# Patient Record
Sex: Female | Born: 1960 | Race: White | Hispanic: No | Marital: Married | Smoking: Never smoker
Health system: Southern US, Community
[De-identification: ages and names within clinical notes are randomized; demographics above are authoritative.]

## PROBLEM LIST (undated history)

## (undated) DIAGNOSIS — F419 Anxiety disorder, unspecified: Secondary | ICD-10-CM

## (undated) DIAGNOSIS — E559 Vitamin D deficiency, unspecified: Secondary | ICD-10-CM

## (undated) DIAGNOSIS — R7303 Prediabetes: Secondary | ICD-10-CM

## (undated) DIAGNOSIS — F32A Depression, unspecified: Secondary | ICD-10-CM

## (undated) DIAGNOSIS — I493 Ventricular premature depolarization: Secondary | ICD-10-CM

## (undated) HISTORY — DX: Ventricular premature depolarization: I49.3

## (undated) HISTORY — DX: Depression, unspecified: F32.A

## (undated) HISTORY — DX: Prediabetes: R73.03

## (undated) HISTORY — DX: Vitamin D deficiency, unspecified: E55.9

## (undated) HISTORY — DX: Anxiety disorder, unspecified: F41.9

## (undated) HISTORY — PX: REFRACTIVE SURGERY: SHX103

---

## 2003-03-22 ENCOUNTER — Ambulatory Visit (HOSPITAL_COMMUNITY): Admission: RE | Admit: 2003-03-22 | Discharge: 2003-03-22 | Payer: Self-pay | Admitting: Gastroenterology

## 2004-01-16 ENCOUNTER — Other Ambulatory Visit: Admission: RE | Admit: 2004-01-16 | Discharge: 2004-01-16 | Payer: Self-pay | Admitting: Family Medicine

## 2004-01-27 ENCOUNTER — Encounter: Admission: RE | Admit: 2004-01-27 | Discharge: 2004-01-27 | Payer: Self-pay | Admitting: Family Medicine

## 2004-02-17 ENCOUNTER — Ambulatory Visit (HOSPITAL_COMMUNITY): Admission: RE | Admit: 2004-02-17 | Discharge: 2004-02-17 | Payer: Self-pay | Admitting: Family Medicine

## 2005-02-04 IMAGING — US US PELVIS COMPLETE MODIFY
1 series · 14 of 25 positions shown · non-contrast
Comparison: none

CLINICAL DATA: Abnormal uterine bleeding.
 PELVIC SONOGRAM WITH TRANSVAGINAL NON-OB
 Overall uterine size and contour within normal limits.  There appear to be no lesions of the myometrium.  
 The endometrium is markedly thickened and inhomogeneous.  It measures as much as 2.2 cm in diameter.  The appearance is worrisome for endometrial carcinoma.  We could be imaging a large blood clot, however.
 The right ovary measures 3.5 x 1.3 x 2.3 cm.  Within it is a 1.6 cm simple cyst.  The left ovary is 2.0 x 1.2 x 1.2 cm .  Within it is a 1.3 cm simple cyst. 
 There is no paraovarian fluid.  A nabothian cyst is noted.  
 IMPRESSION
 1.  Marked thickening and irregularity of the endometrium.  Although this may be a large clot we are imaging, a neoplasm cannot be excluded. 
 2.  No other findings of significance ? there are small bilateral ovarian cysts.

[Series 1: unknown · 0.32mm/px · 14 of 59 slices shown]
[im 1/59]
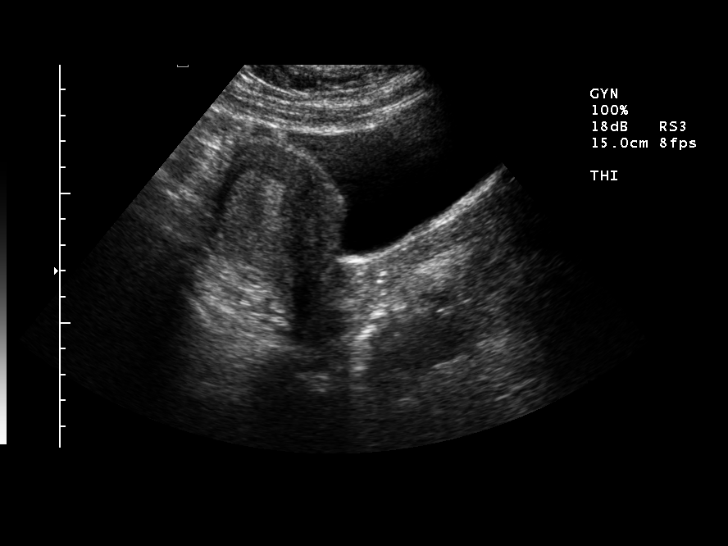
[im 5/59]
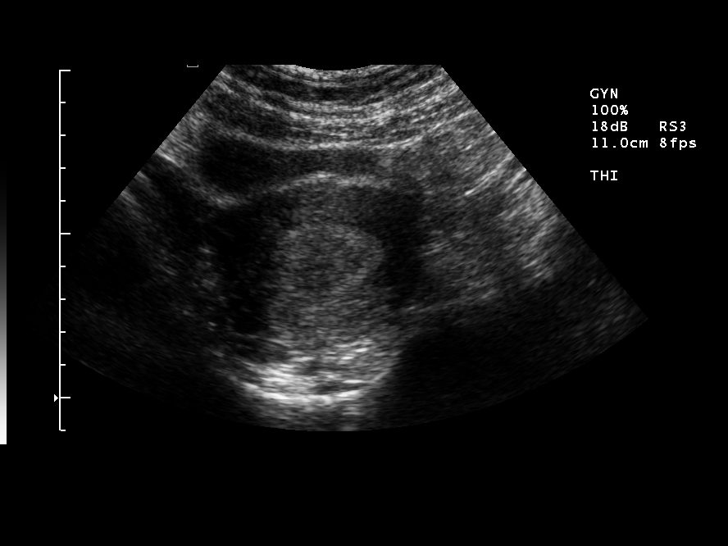
[im 10/59]
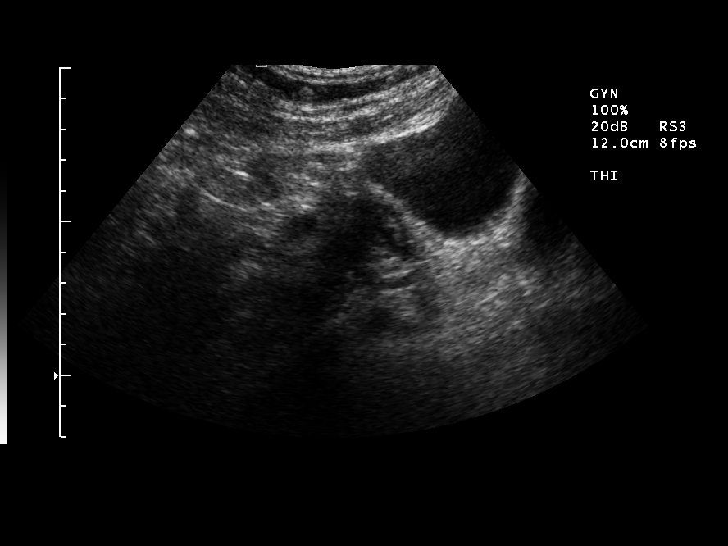
[im 15/59]
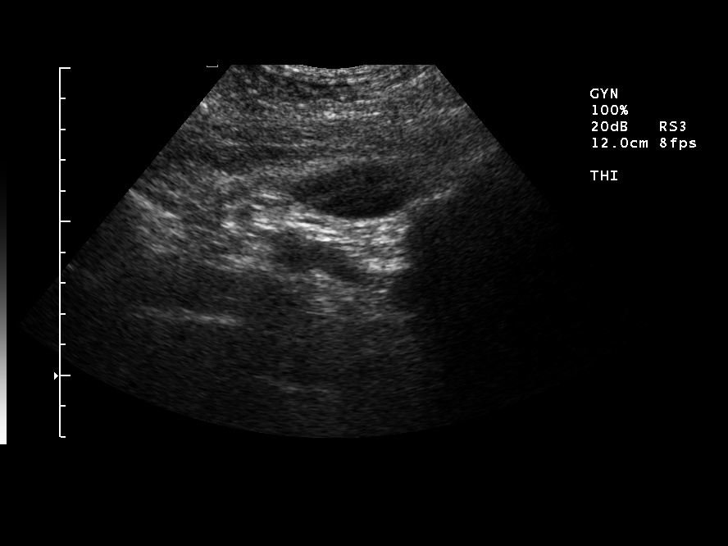
[im 20/59]
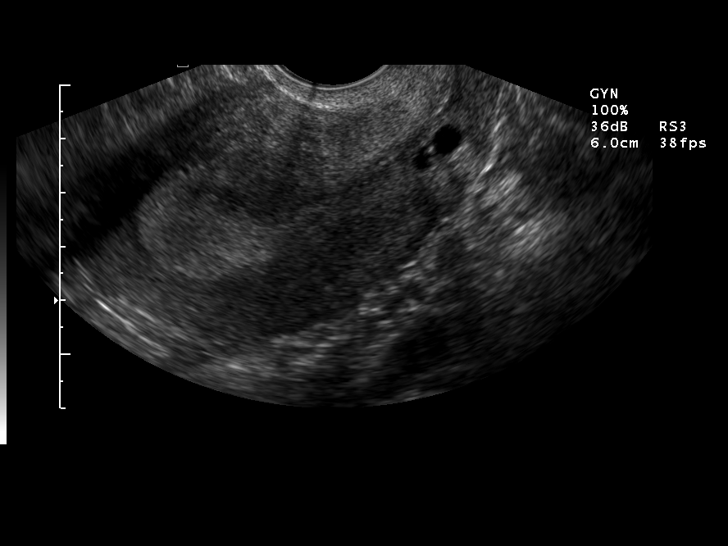
[im 22/59]
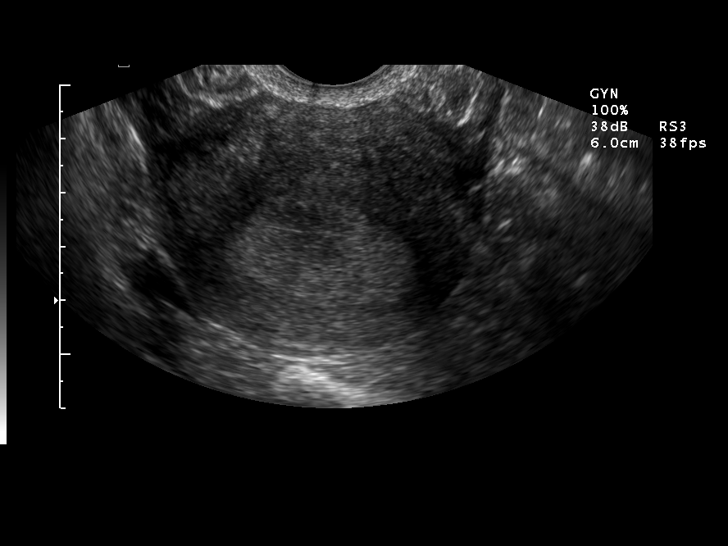
[im 27/59]
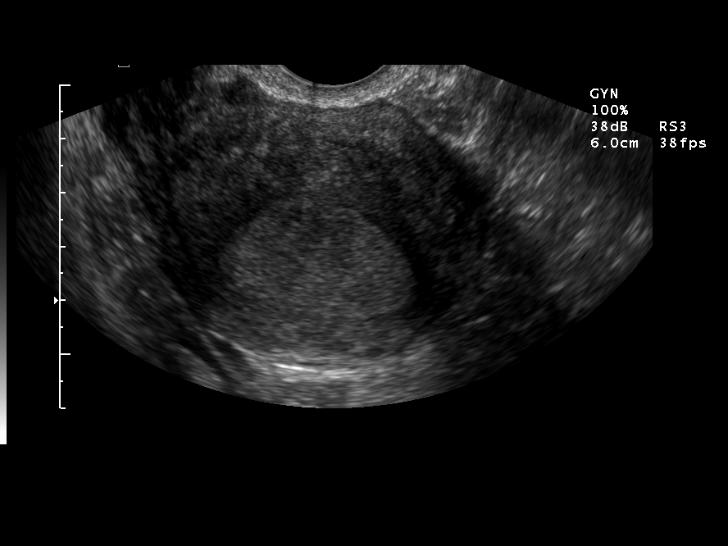
[im 32/59]
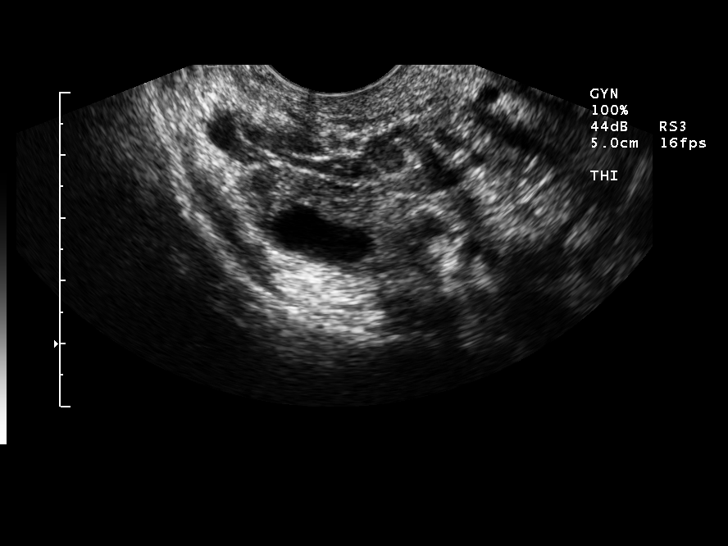
[im 37/59]
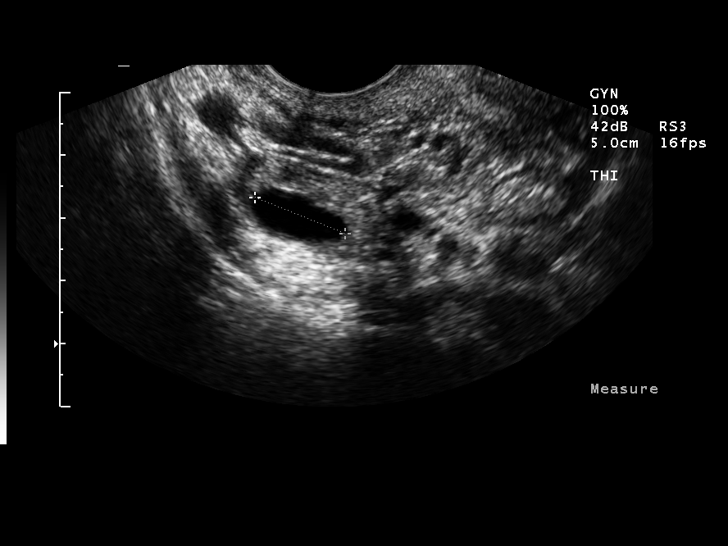
[im 39/59]
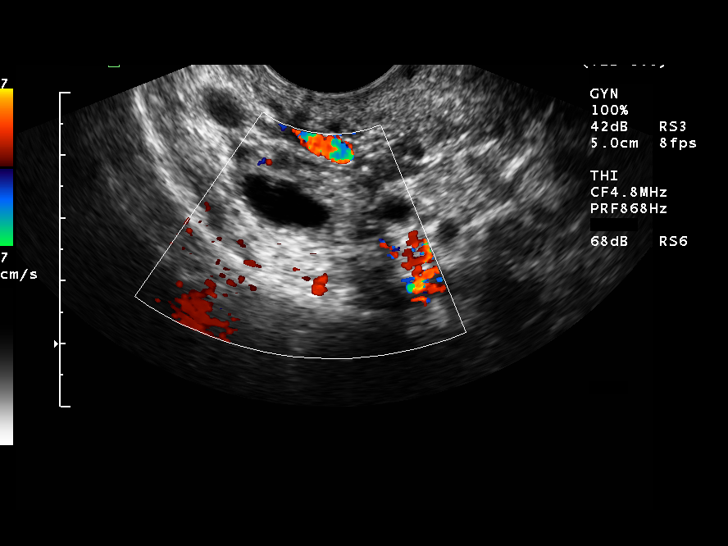
[im 44/59]
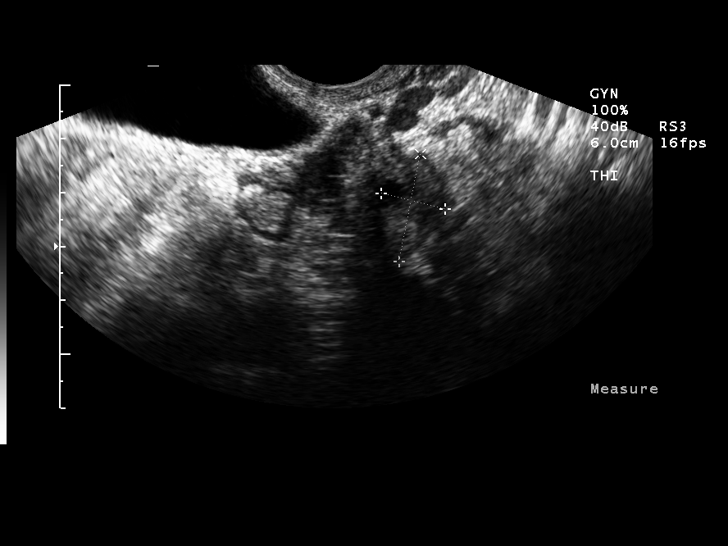
[im 49/59]
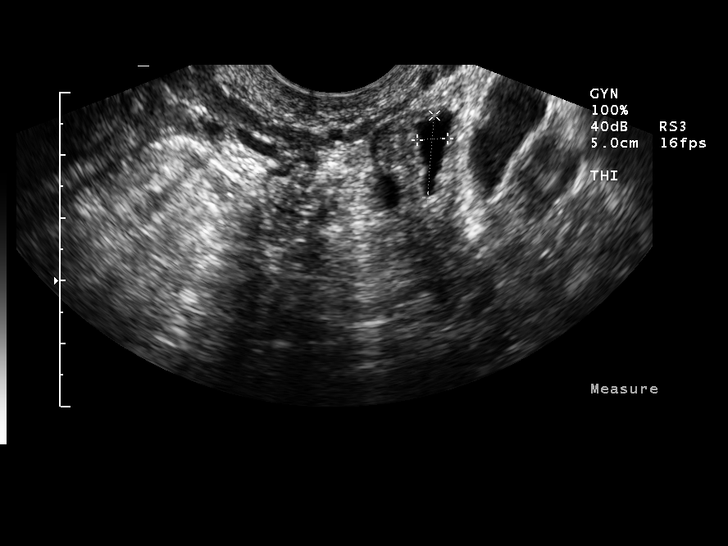
[im 54/59]
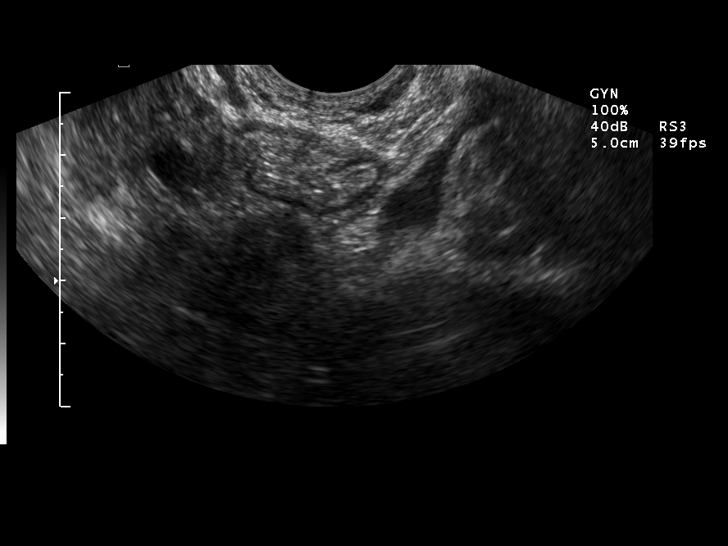
[im 59/59]
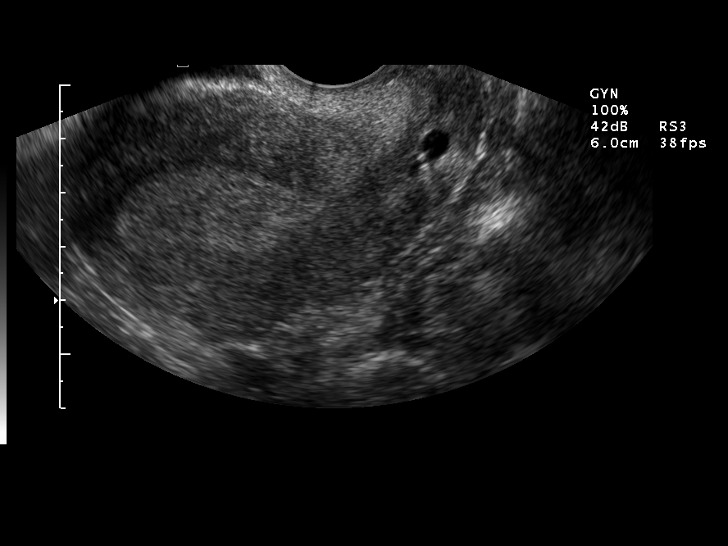

[14 of 25 positions shown; findings below may reference images not displayed]

## 2005-03-16 ENCOUNTER — Other Ambulatory Visit: Admission: RE | Admit: 2005-03-16 | Discharge: 2005-03-16 | Payer: Self-pay | Admitting: Family Medicine

## 2007-04-28 ENCOUNTER — Other Ambulatory Visit: Admission: RE | Admit: 2007-04-28 | Discharge: 2007-04-28 | Payer: Self-pay | Admitting: Family Medicine

## 2008-11-06 LAB — HM COLONOSCOPY

## 2009-09-17 ENCOUNTER — Other Ambulatory Visit: Admission: RE | Admit: 2009-09-17 | Discharge: 2009-09-17 | Payer: Self-pay | Admitting: Family Medicine

## 2010-11-06 NOTE — Op Note (Signed)
   [name], [name]                              ACCOUNT NO.:  [id number]   MEDICAL RECORD NO.:  [id number]                   PATIENT TYPE:  AMB   LOCATION:  ENDO                                 FACILITY:  [facility]   PHYSICIAN:  John C. Madilyn Fireman, M.D.                 DATE OF BIRTH:  19-Sep-1960   DATE OF PROCEDURE:  03/22/2003  DATE OF DISCHARGE:                                 OPERATIVE REPORT   PROCEDURE:  Colonoscopy.   INDICATIONS FOR PROCEDURE:  Family history of colon cancer in a first degree  relative.   DESCRIPTION OF PROCEDURE:  The patient was placed in the left lateral  decubitus position then placed on the pulse monitor with continuous low flow  oxygen delivered by nasal cannula. She was sedated with 87.5 mcg IV fentanyl  and 8 mg IV Versed. The Olympus video colonoscope was inserted into the  rectum and advanced to the cecum, confirmed by transillumination at  McBurney's point and visualization of the ileocecal valve and appendiceal  orifice. The prep was excellent. The cecum, ascending, transverse,  descending and sigmoid colon all appeared normal with no masses, polyps,  diverticula or other mucosal abnormalities. The rectum likewise appeared  normal and retroflexed view of the anus revealed no obvious internal  hemorrhoids. The scope was then withdrawn and the patient returned to the  recovery room in stable condition. She tolerated the procedure well and  there were no immediate complications.   IMPRESSION:  Normal colonoscopy done for indication of family history of  colon cancer.   PLAN:  Repeat study in five years.                                               John C. Madilyn Fireman, M.D.    [facility]MEDQ  D:  03/22/2003  T:  03/23/2003  Job:  [id number]   cc:   Stacie Acres. White, M.D.  [address]  [place]  [place] [address]  Fax: [phone]

## 2011-10-21 ENCOUNTER — Other Ambulatory Visit (HOSPITAL_COMMUNITY)
Admission: RE | Admit: 2011-10-21 | Discharge: 2011-10-21 | Disposition: A | Discharge status: Home / Self Care | Payer: BC Managed Care – PPO | Source: Ambulatory Visit | Attending: Family Medicine | Admitting: Family Medicine

## 2011-10-21 ENCOUNTER — Other Ambulatory Visit: Payer: Self-pay | Admitting: Family Medicine

## 2011-10-21 DIAGNOSIS — Z0000 Encounter for general adult medical examination without abnormal findings: Secondary | ICD-10-CM | POA: Insufficient documentation

## 2014-11-15 ENCOUNTER — Other Ambulatory Visit: Payer: Self-pay | Admitting: Family Medicine

## 2014-11-19 LAB — CYTOLOGY - PAP

## 2015-11-20 DIAGNOSIS — F411 Generalized anxiety disorder: Secondary | ICD-10-CM | POA: Diagnosis not present

## 2015-11-20 DIAGNOSIS — E785 Hyperlipidemia, unspecified: Secondary | ICD-10-CM | POA: Diagnosis not present

## 2015-11-20 DIAGNOSIS — E559 Vitamin D deficiency, unspecified: Secondary | ICD-10-CM | POA: Diagnosis not present

## 2015-11-20 DIAGNOSIS — Z0000 Encounter for general adult medical examination without abnormal findings: Secondary | ICD-10-CM | POA: Diagnosis not present

## 2015-11-20 DIAGNOSIS — M21619 Bunion of unspecified foot: Secondary | ICD-10-CM | POA: Diagnosis not present

## 2016-05-07 DIAGNOSIS — L0100 Impetigo, unspecified: Secondary | ICD-10-CM | POA: Diagnosis not present

## 2016-05-25 DIAGNOSIS — E785 Hyperlipidemia, unspecified: Secondary | ICD-10-CM | POA: Diagnosis not present

## 2016-05-25 DIAGNOSIS — R7301 Impaired fasting glucose: Secondary | ICD-10-CM | POA: Diagnosis not present

## 2016-05-25 DIAGNOSIS — F411 Generalized anxiety disorder: Secondary | ICD-10-CM | POA: Diagnosis not present

## 2016-05-25 DIAGNOSIS — Z23 Encounter for immunization: Secondary | ICD-10-CM | POA: Diagnosis not present

## 2016-05-31 DIAGNOSIS — L309 Dermatitis, unspecified: Secondary | ICD-10-CM | POA: Diagnosis not present

## 2016-08-14 DIAGNOSIS — T783XXA Angioneurotic edema, initial encounter: Secondary | ICD-10-CM | POA: Diagnosis not present

## 2016-08-31 DIAGNOSIS — J305 Allergic rhinitis due to food: Secondary | ICD-10-CM | POA: Diagnosis not present

## 2016-08-31 DIAGNOSIS — J301 Allergic rhinitis due to pollen: Secondary | ICD-10-CM | POA: Diagnosis not present

## 2016-10-22 DIAGNOSIS — Z1231 Encounter for screening mammogram for malignant neoplasm of breast: Secondary | ICD-10-CM | POA: Diagnosis not present

## 2016-11-19 DIAGNOSIS — E559 Vitamin D deficiency, unspecified: Secondary | ICD-10-CM | POA: Diagnosis not present

## 2016-11-19 DIAGNOSIS — R7303 Prediabetes: Secondary | ICD-10-CM | POA: Diagnosis not present

## 2016-11-19 DIAGNOSIS — Z9109 Other allergy status, other than to drugs and biological substances: Secondary | ICD-10-CM | POA: Diagnosis not present

## 2016-11-19 DIAGNOSIS — F419 Anxiety disorder, unspecified: Secondary | ICD-10-CM | POA: Diagnosis not present

## 2016-11-19 DIAGNOSIS — E785 Hyperlipidemia, unspecified: Secondary | ICD-10-CM | POA: Diagnosis not present

## 2016-11-19 DIAGNOSIS — L309 Dermatitis, unspecified: Secondary | ICD-10-CM | POA: Diagnosis not present

## 2017-04-25 DIAGNOSIS — Z23 Encounter for immunization: Secondary | ICD-10-CM | POA: Diagnosis not present

## 2017-06-06 ENCOUNTER — Other Ambulatory Visit: Payer: Self-pay | Admitting: Family Medicine

## 2017-06-06 ENCOUNTER — Other Ambulatory Visit (HOSPITAL_COMMUNITY)
Admission: RE | Admit: 2017-06-06 | Discharge: 2017-06-06 | Disposition: A | Discharge status: Home / Self Care | Payer: BLUE CROSS/BLUE SHIELD | Source: Ambulatory Visit | Attending: Family Medicine | Admitting: Family Medicine

## 2017-06-06 DIAGNOSIS — Z0000 Encounter for general adult medical examination without abnormal findings: Secondary | ICD-10-CM | POA: Diagnosis not present

## 2017-06-06 DIAGNOSIS — F411 Generalized anxiety disorder: Secondary | ICD-10-CM | POA: Diagnosis not present

## 2017-06-06 DIAGNOSIS — Z124 Encounter for screening for malignant neoplasm of cervix: Secondary | ICD-10-CM | POA: Diagnosis not present

## 2017-06-06 DIAGNOSIS — E785 Hyperlipidemia, unspecified: Secondary | ICD-10-CM | POA: Diagnosis not present

## 2017-06-06 DIAGNOSIS — E559 Vitamin D deficiency, unspecified: Secondary | ICD-10-CM | POA: Diagnosis not present

## 2017-06-06 DIAGNOSIS — Z6833 Body mass index (BMI) 33.0-33.9, adult: Secondary | ICD-10-CM | POA: Diagnosis not present

## 2017-06-08 LAB — CYTOLOGY - PAP
DIAGNOSIS: NEGATIVE
HPV (WINDOPATH): NOT DETECTED

## 2017-12-13 DIAGNOSIS — F419 Anxiety disorder, unspecified: Secondary | ICD-10-CM | POA: Diagnosis not present

## 2017-12-13 DIAGNOSIS — Z23 Encounter for immunization: Secondary | ICD-10-CM | POA: Diagnosis not present

## 2018-02-17 DIAGNOSIS — R300 Dysuria: Secondary | ICD-10-CM | POA: Diagnosis not present

## 2018-03-17 DIAGNOSIS — Z23 Encounter for immunization: Secondary | ICD-10-CM | POA: Diagnosis not present

## 2018-11-20 DIAGNOSIS — R300 Dysuria: Secondary | ICD-10-CM | POA: Diagnosis not present

## 2018-11-20 DIAGNOSIS — N3000 Acute cystitis without hematuria: Secondary | ICD-10-CM | POA: Diagnosis not present

## 2018-12-04 DIAGNOSIS — E559 Vitamin D deficiency, unspecified: Secondary | ICD-10-CM | POA: Diagnosis not present

## 2018-12-04 DIAGNOSIS — R7303 Prediabetes: Secondary | ICD-10-CM | POA: Diagnosis not present

## 2018-12-04 DIAGNOSIS — E785 Hyperlipidemia, unspecified: Secondary | ICD-10-CM | POA: Diagnosis not present

## 2018-12-04 DIAGNOSIS — F411 Generalized anxiety disorder: Secondary | ICD-10-CM | POA: Diagnosis not present

## 2019-04-04 DIAGNOSIS — Z1231 Encounter for screening mammogram for malignant neoplasm of breast: Secondary | ICD-10-CM | POA: Diagnosis not present

## 2019-04-24 DIAGNOSIS — E559 Vitamin D deficiency, unspecified: Secondary | ICD-10-CM | POA: Diagnosis not present

## 2019-04-24 DIAGNOSIS — E785 Hyperlipidemia, unspecified: Secondary | ICD-10-CM | POA: Diagnosis not present

## 2019-04-24 DIAGNOSIS — R7303 Prediabetes: Secondary | ICD-10-CM | POA: Diagnosis not present

## 2019-06-04 ENCOUNTER — Other Ambulatory Visit: Payer: Self-pay | Admitting: Family Medicine

## 2019-06-04 DIAGNOSIS — Z1231 Encounter for screening mammogram for malignant neoplasm of breast: Secondary | ICD-10-CM

## 2019-06-12 DIAGNOSIS — Z20828 Contact with and (suspected) exposure to other viral communicable diseases: Secondary | ICD-10-CM | POA: Diagnosis not present

## 2019-12-11 ENCOUNTER — Ambulatory Visit (INDEPENDENT_AMBULATORY_CARE_PROVIDER_SITE_OTHER): Payer: 59 | Admitting: Family Medicine

## 2019-12-11 ENCOUNTER — Encounter (INDEPENDENT_AMBULATORY_CARE_PROVIDER_SITE_OTHER): Payer: Self-pay | Admitting: Family Medicine

## 2019-12-11 ENCOUNTER — Other Ambulatory Visit: Payer: Self-pay

## 2019-12-11 VITALS — BP 122/76 | HR 60 | Temp 98.0°F | Ht 64.0 in | Wt 185.0 lb

## 2019-12-11 DIAGNOSIS — F3289 Other specified depressive episodes: Secondary | ICD-10-CM | POA: Diagnosis not present

## 2019-12-11 DIAGNOSIS — E669 Obesity, unspecified: Secondary | ICD-10-CM

## 2019-12-11 DIAGNOSIS — R7303 Prediabetes: Secondary | ICD-10-CM

## 2019-12-11 DIAGNOSIS — Z0289 Encounter for other administrative examinations: Secondary | ICD-10-CM

## 2019-12-11 DIAGNOSIS — R5383 Other fatigue: Secondary | ICD-10-CM

## 2019-12-11 DIAGNOSIS — Z9189 Other specified personal risk factors, not elsewhere classified: Secondary | ICD-10-CM

## 2019-12-11 DIAGNOSIS — E7849 Other hyperlipidemia: Secondary | ICD-10-CM

## 2019-12-11 DIAGNOSIS — R0602 Shortness of breath: Secondary | ICD-10-CM

## 2019-12-11 DIAGNOSIS — Z6831 Body mass index (BMI) 31.0-31.9, adult: Secondary | ICD-10-CM

## 2019-12-11 DIAGNOSIS — E559 Vitamin D deficiency, unspecified: Secondary | ICD-10-CM | POA: Diagnosis not present

## 2019-12-11 NOTE — Progress Notes (Signed)
Dear Dr. Cliffton Asters,   Thank you for referring [name] to our clinic. The following note includes my evaluation and treatment recommendations.  Chief Complaint:   OBESITY [name] (MR# [id number]) is a 59 y.o. female who presents for evaluation and treatment of obesity and related comorbidities. Current BMI is Body mass index is 31.76 kg/m. Laportia has been struggling with her weight for many years and has been unsuccessful in either losing weight, maintaining weight loss, or reaching her healthy weight goal.  [name] is currently in the action stage of change and ready to dedicate time achieving and maintaining a healthier weight. [name] is interested in becoming our patient and working on intensive lifestyle modifications including (but not limited to) diet and exercise for weight loss.  [name] sees [name] at [facility].  She had labs on 10/24/2019.  Her husband was recently diagnosed with diabetes mellitus in May 2021.  She will be driving with her family out to [place] and will be staying in a hotel.  [name]'s habits were reviewed today and are as follows: Her family eats meals together, she thinks her family will eat healthier with her, her desired weight loss is 20 pounds, she has been heavy most of her life, her heaviest weight ever was 195 pounds, she craves a [employer] mocha frappe, she snacks frequently in the evenings, she frequently makes poor food choices, she frequently eats larger portions than normal and she struggles with emotional eating.  Depression Screen Brandee's Food and Mood (modified PHQ-9) score was 2.  Depression screen [facility] 2/9 12/11/2019  Decreased Interest 0  Down, Depressed, Hopeless 1  PHQ - 2 Score 1  Altered sleeping 0  Tired, decreased energy 0  Change in appetite 1  Feeling bad or failure about yourself  0  Trouble concentrating 0  Moving slowly or fidgety/restless 0  Suicidal thoughts 0  PHQ-9 Score 2  Difficult doing work/chores Not  difficult at all   Subjective:   1. Other fatigue Charmeka admits to daytime somnolence and denies waking up still tired. Patent has a history of symptoms of morning fatigue. Jeffrey generally gets 8 or 9 hours of sleep per night, and states that she has generally restful sleep. Snoring is not present. Apneic episodes are not present. Epworth Sleepiness Score is 6.  [name] says she was told in the past that she had an "adrenal issue" that was making her tired.  2. SOB (shortness of breath) on exertion [name] notes increasing shortness of breath with exercising and seems to be worsening over time with weight gain. She notes getting out of breath sooner with activity than she used to. This has gotten worse recently. [name] denies shortness of breath at rest or orthopnea.  3. Vitamin D deficiency She is currently taking OTC vitamin D. She denies nausea, vomiting or muscle weakness.  4. Prediabetes [name] has a diagnosis of prediabetes based on her elevated HgA1c and was informed this puts her at greater risk of developing diabetes. She continues to work on diet and exercise to decrease her risk of diabetes. She denies nausea or hypoglycemia.  She was told she was "borderline" many years ago.  A1c was 5.8 on 10/24/2019.  Highest was 6.0 in 04/2019.  5. Other hyperlipidemia [name] has hyperlipidemia and has been trying to improve her cholesterol levels with intensive lifestyle modification including a low saturated fat diet, exercise and weight loss. She denies any chest pain, claudication or myalgias.  LDL 146 on 10/24/2019.  Had had for years, per patient.  LDL in 2011 was 159.  6. Other depression with emotional eating  [name] is struggling with emotional eating and using food for comfort to the extent that it is negatively impacting her health. She has been working on behavior modification techniques to help reduce her emotional eating and has been unsuccessful. She shows no sign of suicidal or homicidal  ideations.  She is taking medications per her PCP.  She is doing emotional eating and struggles with those challenges and uses food to self-soothe.  Uses food as a reward.  7. At risk for diabetes mellitus [name] is at higher than average risk for developing diabetes due to her obesity.   Assessment/Plan:   1. Other fatigue [name] does feel that her weight is causing her energy to be lower than it should be. Fatigue may be related to obesity, depression or many other causes. Labs will be ordered, and in the meanwhile, [name] will focus on self care including making healthy food choices, increasing physical activity and focusing on stress reduction.  Check labs, ECG, increase nutrient rich foods, weight loss. - EKG 12-Lead - Vitamin B12 - T3 - T4, free - TSH  2. SOB (shortness of breath) on exertion [name] does feel that she gets out of breath more easily that she used to when she exercises. [name]'s shortness of breath appears to be obesity related and exercise induced. She has agreed to work on weight loss and gradually increase exercise to treat her exercise induced shortness of breath. Will continue to monitor closely. - Vitamin B12 - T3 - T4, free - TSH  3. Vitamin D deficiency Low Vitamin D level contributes to fatigue and are associated with obesity, breast, and colon cancer. She agrees to continue to take OTC Vitamin D daily and will follow-up for routine testing of Vitamin D, at least 2-3 times per year to avoid over-replacement.  Check vitamin D level today.  Focus on a prudent diet and weight loss. - VITAMIN D 25 Hydroxy (Vit-D Deficiency, Fractures)  4. Prediabetes [name] will continue to work on weight loss, exercise, and decreasing simple carbohydrates to help decrease the risk of diabetes.  Check labs.  Focus on prudent diet and weight loss. - Comprehensive metabolic panel - CBC with Differential/Platelet - Hemoglobin A1c - Insulin, random  5. Other  hyperlipidemia Cardiovascular risk and specific lipid/LDL goals reviewed.  We discussed several lifestyle modifications today and [name] will continue to work on diet, exercise and weight loss efforts. Orders and follow up as documented in patient record.  Will check labs today.  Work on diet and weight loss.  Counseling Intensive lifestyle modifications are the first line treatment for this issue. . Dietary changes: Increase soluble fiber. Decrease simple carbohydrates. . Exercise changes: Moderate to vigorous-intensity aerobic activity 150 minutes per week if tolerated. . Lipid-lowering medications: see documented in medical record. - Lipid Panel With LDL/HDL Ratio  6. Other depression with emotional eating  Patient was referred to Dr. Dewaine Conger, our Bariatric Psychologist, for evaluation due to her elevated PHQ-9 score and significant struggles with emotional eating.  Continue medications per PCP.  7. At risk for diabetes mellitus Manaia was given approximately 15 minutes of diabetes education and counseling today. We discussed intensive lifestyle modifications today with an emphasis on weight loss as well as increasing exercise and decreasing simple carbohydrates in her diet. We also reviewed medication options with an emphasis on risk versus benefit of those  discussed.   Repetitive spaced learning was employed today to elicit superior memory formation and behavioral change.  8. Class 1 obesity with serious comorbidity and body mass index (BMI) of 31.0 to 31.9 in adult, unspecified obesity type Capricia is currently in the action stage of change and her goal is to continue with weight loss efforts. I recommend Querida begin the structured treatment plan as follows:  She has agreed to the Category 2 Plan.  Exercise goals: As is.   Behavioral modification strategies: increasing lean protein intake and decreasing eating out (currently 7-10 times per week).  She was informed of the importance of  frequent follow-up visits to maximize her success with intensive lifestyle modifications for her multiple health conditions. She was informed we would discuss her lab results at her next visit unless there is a critical issue that needs to be addressed sooner. Jailin agreed to keep her next visit at the agreed upon time to discuss these results.  Objective:   Blood pressure 122/76, pulse 60, temperature 98 F (36.7 C), temperature source Oral, height 5\' 4"  (1.626 m), weight 185 lb (83.9 kg), SpO2 98 %. Body mass index is 31.76 kg/m.  EKG: Normal sinus rhythm, rate 69 bpm.  Indirect Calorimeter completed today shows a VO2 of 225 and a REE of 1567.  Her calculated basal metabolic rate is 3818 thus her basal metabolic rate is better than expected.  General: Cooperative, alert, well developed, in no acute distress. HEENT: Conjunctivae and lids unremarkable. Cardiovascular: Regular rhythm.  Lungs: Normal work of breathing. Neurologic: No focal deficits.   Attestation Statements:   Reviewed by clinician on day of visit: allergies, medications, problem list, medical history, surgical history, family history, social history, and previous encounter notes.  I, Water quality scientist, CMA, am acting as Location manager for [employer], DO.  I have reviewed the above documentation for accuracy and completeness, and I agree with the above. Mellody Dance, DO

## 2019-12-12 LAB — LIPID PANEL WITH LDL/HDL RATIO
Cholesterol, Total: 248 mg/dL — ABNORMAL HIGH (ref 100–199)
HDL: 50 mg/dL (ref >39)
LDL Chol Calc (NIH): 177 mg/dL — ABNORMAL HIGH (ref 0–99)
LDL/HDL Ratio: 3.5 ratio — ABNORMAL HIGH (ref 0.0–3.2)
Triglycerides: 119 mg/dL (ref 0–149)
VLDL Cholesterol Cal: 21 mg/dL (ref 5–40)

## 2019-12-12 LAB — T4, FREE: Free T4: 1.24 ng/dL (ref 0.82–1.77)

## 2019-12-12 LAB — CBC WITH DIFFERENTIAL/PLATELET
Basophils Absolute: 0.1 10*3/uL (ref 0.0–0.2)
Basos: 1 %
EOS (ABSOLUTE): 0.2 10*3/uL (ref 0.0–0.4)
Eos: 2 %
Hematocrit: 42.3 % (ref 34.0–46.6)
Hemoglobin: 14.1 g/dL (ref 11.1–15.9)
Immature Grans (Abs): 0 10*3/uL (ref 0.0–0.1)
Immature Granulocytes: 0 %
Lymphocytes Absolute: 2.1 10*3/uL (ref 0.7–3.1)
Lymphs: 33 %
MCH: 27.8 pg (ref 26.6–33.0)
MCHC: 33.3 g/dL (ref 31.5–35.7)
MCV: 83 fL (ref 79–97)
Monocytes Absolute: 0.5 10*3/uL (ref 0.1–0.9)
Monocytes: 9 %
Neutrophils Absolute: 3.4 10*3/uL (ref 1.4–7.0)
Neutrophils: 55 %
Platelets: 289 10*3/uL (ref 150–450)
RBC: 5.07 x10E6/uL (ref 3.77–5.28)
RDW: 14.7 % (ref 11.7–15.4)
WBC: 6.2 10*3/uL (ref 3.4–10.8)

## 2019-12-12 LAB — COMPREHENSIVE METABOLIC PANEL
ALT: 13 IU/L (ref 0–32)
AST: 21 IU/L (ref 0–40)
Albumin/Globulin Ratio: 1.6 (ref 1.2–2.2)
Albumin: 4.6 g/dL (ref 3.8–4.8)
Alkaline Phosphatase: 73 IU/L (ref 48–121)
BUN/Creatinine Ratio: 21 (ref 12–28)
BUN: 17 mg/dL (ref 8–27)
Bilirubin Total: 0.4 mg/dL (ref 0.0–1.2)
CO2: 26 mmol/L (ref 20–29)
Calcium: 9.8 mg/dL (ref 8.7–10.3)
Chloride: 99 mmol/L (ref 96–106)
Creatinine, Ser: 0.81 mg/dL (ref 0.57–1.00)
GFR calc Af Amer: 91 mL/min/{1.73_m2} (ref >59)
GFR calc non Af Amer: 79 mL/min/{1.73_m2} (ref >59)
Globulin, Total: 2.9 g/dL (ref 1.5–4.5)
Glucose: 100 mg/dL — ABNORMAL HIGH (ref 65–99)
Potassium: 4.2 mmol/L (ref 3.5–5.2)
Sodium: 137 mmol/L (ref 134–144)
Total Protein: 7.5 g/dL (ref 6.0–8.5)

## 2019-12-12 LAB — INSULIN, RANDOM: INSULIN: 4.9 u[IU]/mL (ref 2.6–24.9)

## 2019-12-12 LAB — TSH: TSH: 1.17 u[IU]/mL (ref 0.450–4.500)

## 2019-12-12 LAB — T3: T3, Total: 104 ng/dL (ref 71–180)

## 2019-12-12 LAB — HEMOGLOBIN A1C
Est. average glucose Bld gHb Est-mCnc: 123 mg/dL
Hgb A1c MFr Bld: 5.9 % — ABNORMAL HIGH (ref 4.8–5.6)

## 2019-12-12 LAB — VITAMIN B12: Vitamin B-12: 1542 pg/mL — ABNORMAL HIGH (ref 232–1245)

## 2019-12-12 LAB — VITAMIN D 25 HYDROXY (VIT D DEFICIENCY, FRACTURES): Vit D, 25-Hydroxy: 40.2 ng/mL (ref 30.0–100.0)

## 2019-12-20 ENCOUNTER — Encounter (INDEPENDENT_AMBULATORY_CARE_PROVIDER_SITE_OTHER): Payer: Self-pay

## 2019-12-25 ENCOUNTER — Other Ambulatory Visit: Payer: Self-pay

## 2019-12-25 ENCOUNTER — Encounter (INDEPENDENT_AMBULATORY_CARE_PROVIDER_SITE_OTHER): Payer: Self-pay | Admitting: Family Medicine

## 2019-12-25 ENCOUNTER — Ambulatory Visit (INDEPENDENT_AMBULATORY_CARE_PROVIDER_SITE_OTHER): Payer: 59 | Admitting: Family Medicine

## 2019-12-25 VITALS — BP 124/85 | HR 74 | Temp 98.6°F | Ht 64.0 in | Wt 183.0 lb

## 2019-12-25 DIAGNOSIS — E669 Obesity, unspecified: Secondary | ICD-10-CM

## 2019-12-25 DIAGNOSIS — Z6831 Body mass index (BMI) 31.0-31.9, adult: Secondary | ICD-10-CM

## 2019-12-25 DIAGNOSIS — E7849 Other hyperlipidemia: Secondary | ICD-10-CM

## 2019-12-25 DIAGNOSIS — Z9189 Other specified personal risk factors, not elsewhere classified: Secondary | ICD-10-CM | POA: Diagnosis not present

## 2019-12-25 DIAGNOSIS — E559 Vitamin D deficiency, unspecified: Secondary | ICD-10-CM

## 2019-12-25 DIAGNOSIS — R7303 Prediabetes: Secondary | ICD-10-CM | POA: Diagnosis not present

## 2019-12-25 DIAGNOSIS — E538 Deficiency of other specified B group vitamins: Secondary | ICD-10-CM | POA: Diagnosis not present

## 2019-12-25 MED ORDER — VITAMIN D (ERGOCALCIFEROL) 1.25 MG (50000 UNIT) PO CAPS: 50000.0000 [IU] | ORAL_CAPSULE | ORAL | 0 refills | Status: DC

## 2019-12-26 NOTE — Progress Notes (Signed)
Chief Complaint:   OBESITY [name] is here to discuss her progress with her obesity treatment plan along with follow-up of her obesity related diagnoses. [name] is on the Category 2 Plan and states she is following her eating plan approximately 100% of the time. [name] states she is hiking for 3 miles and using the exercise bike for 30 minutes 7 times per week.  Today's visit was #: 2 Starting weight: 185 lbs Starting date: 12/11/2019 Today's weight: 183 lbs Today's date: 12/25/2019 Total lbs lost to date: 2 lbs Total lbs lost since last in-office visit: 2 lbs  Interim History: [name] has continued with her low carb diet that she was on before she came in to see [place].  She was anxious about starting Category 2 because of upcoming travel to [place] and dining for a total of 3 weeks and did not want to change from her existing plan.  She has been following the low carb diet 100% of the time.  She is not tracking or does not have a carb goal, etc.  PCP looked at her new labs as well recently.  She is here for [place] to review them in detail today.  Subjective:   1. Prediabetes [name] has a diagnosis of prediabetes based on her elevated HgA1c and was informed this puts her at greater risk of developing diabetes. She continues to work on diet and exercise to decrease her risk of diabetes. She denies nausea or hypoglycemia.  A1c was 5.7 in May 2021, and is now 5.9.  She is not sure how many calories she eats per day.  She states she has "a very high metabolism" and was concerned to start on our nutrition plan because of these beliefs.   Lab Results  Component Value Date   HGBA1C 5.9 (H) 12/11/2019   Lab Results  Component Value Date   INSULIN 4.9 12/11/2019   2. Vitamin D deficiency [name]'s Vitamin D level was 40.2 on 12/11/2019. She is currently taking OTC vitamin D 1000 IU each day. She denies nausea, vomiting or muscle weakness.  3. B12 deficiency She is not a vegetarian.  She does not have  a previous diagnosis of pernicious anemia.  She does not have a history of weight loss surgery.  She is taking a multivitamin daily.  She is not sure of the amount of B12 in there.  Lab Results  Component Value Date   VITAMINB12 1,542 (H) 12/11/2019   4. Other hyperlipidemia [name] has hyperlipidemia and has been trying to improve her cholesterol levels with intensive lifestyle modification including a low saturated fat diet, exercise and weight loss. She denies any chest pain, claudication or myalgias.  PCP saw her labs and did not recommend starting therapy.  Labs are worse from prior.  Now ASCVD 10-year risk almost at 5.0.  She is eating low carb but is unsure of saturated/trans fat in her diet.  Lab Results  Component Value Date   ALT 13 12/11/2019   AST 21 12/11/2019   ALKPHOS 73 12/11/2019   BILITOT 0.4 12/11/2019   Lab Results  Component Value Date   CHOL 248 (H) 12/11/2019   HDL 50 12/11/2019   LDLCALC 177 (H) 12/11/2019   TRIG 119 12/11/2019   5. At risk for diabetes mellitus [name] is at higher than average risk for developing diabetes due to her obesity and increasing obesity over the last several months.   Assessment/Plan:   1. Prediabetes [name] will continue  to work on weight loss, exercise, and decreasing simple carbohydrates to help decrease the risk of diabetes.  Recommended to her that she follow prudent nutritional plan once she returns from travel.  Extensive education done regarding labs, prevention of onset of DM, etc.  2. Vitamin D deficiency Low Vitamin D level contributes to fatigue and are associated with obesity, breast, and colon cancer. She agrees to start to take prescription Vitamin D @50 ,000 IU every week and will follow-up for routine testing of Vitamin D, at least 2-3 times per year to avoid over-replacement.  She will stop OTC vitamin D.  Will recheck in 3-4 months.  Continue prudent nutritional plan. - Vitamin D, Ergocalciferol, (DRISDOL) 1.25 MG  (50000 UNIT) CAPS capsule; Take 1 capsule (50,000 Units total) by mouth every 7 (seven) days.  Dispense: 4 capsule; Refill: 0  3. B12 deficiency I recommend cutting the B12 dose in half of what it is currently.  Prudent nutritional plan.  The diagnosis was reviewed with the patient. Counseling provided today, see below. We will continue to monitor. Orders and follow up as documented in patient record.   Counseling . The body needs vitamin B12: to make red blood cells; to make DNA; and to help the nerves work properly so they can carry messages from the brain to the body.  . The main causes of vitamin B12 deficiency include dietary deficiency, digestive diseases, pernicious anemia, and having a surgery in which part of the stomach or small intestine is removed.  . Certain medicines can make it harder for the body to absorb vitamin B12. These medicines include: heartburn medications; some antibiotics; some medications used to treat diabetes, gout, and high cholesterol.  . In some cases, there are no symptoms of this condition. If the condition leads to anemia or nerve damage, various symptoms can occur, such as weakness or fatigue, shortness of breath, and numbness or tingling in your hands and feet.   . Treatment:  o May include taking vitamin B12 supplements.  o Avoid alcohol.  o Eat lots of healthy foods that contain vitamin B12: - Beef, pork, chicken, , and organ meats, such as liver.  - Seafood: This includes clams, rainbow trout, salmon, tuna, and haddock. Eggs.  - Cereal and dairy products that are fortified: This means that vitamin B12 has been added to the food.   4. Other hyperlipidemia Cardiovascular risk and specific lipid/LDL goals reviewed.  We discussed several lifestyle modifications today and Kaleya will continue to work on diet, exercise and weight loss efforts. Orders and follow up as documented in patient record.  Prudent nutritional plan recommended.  Extensive counseling  done.  Counseling Intensive lifestyle modifications are the first line treatment for this issue. . Dietary changes: Increase soluble fiber. Decrease simple carbohydrates. . Exercise changes: Moderate to vigorous-intensity aerobic activity 150 minutes per week if tolerated. Lipid-lowering medications: see documented in medical record.  5. At risk for diabetes mellitus [name] was given approximately 15 minutes of diabetes education and counseling today. We discussed intensive lifestyle modifications today with an emphasis on weight loss as well as increasing exercise and decreasing simple carbohydrates in her diet. We also reviewed medication options with an emphasis on risk versus benefit of those discussed.   Repetitive spaced learning was employed today to elicit superior memory formation and behavioral change.  6. Class 1 obesity with serious comorbidity and body mass index (BMI) of 31.0 to 31.9 in adult, unspecified obesity type [name] is currently in the action stage  of change. As such, her goal is to continue with weight loss efforts. She has agreed to following a lower carbohydrate, vegetable and lean protein rich diet plan.   Keep low carb but use MyFitness Pal and track macros (carbs) and calories (less than 30 grams of carbs per day).  Exercise goals: As is.  Behavioral modification strategies: increasing vegetables.  [name] has agreed to follow-up with our clinic in 2 weeks. She was informed of the importance of frequent follow-up visits to maximize her success with intensive lifestyle modifications for her multiple health conditions.   Objective:   Blood pressure 124/85, pulse 74, temperature 98.6 F (37 C), temperature source Oral, height 5\' 4"  (1.626 m), weight 183 lb (83 kg), SpO2 99 %. Body mass index is 31.41 kg/m.  General: Cooperative, alert, well developed, in no acute distress. HEENT: Conjunctivae and lids unremarkable. Cardiovascular: Regular rhythm.  Lungs: Normal  work of breathing. Neurologic: No focal deficits.   Lab Results  Component Value Date   CREATININE 0.81 12/11/2019   BUN 17 12/11/2019   NA 137 12/11/2019   K 4.2 12/11/2019   CL 99 12/11/2019   CO2 26 12/11/2019   Lab Results  Component Value Date   ALT 13 12/11/2019   AST 21 12/11/2019   ALKPHOS 73 12/11/2019   BILITOT 0.4 12/11/2019   Lab Results  Component Value Date   HGBA1C 5.9 (H) 12/11/2019   Lab Results  Component Value Date   INSULIN 4.9 12/11/2019   Lab Results  Component Value Date   TSH 1.170 12/11/2019   Lab Results  Component Value Date   CHOL 248 (H) 12/11/2019   HDL 50 12/11/2019   LDLCALC 177 (H) 12/11/2019   TRIG 119 12/11/2019   Lab Results  Component Value Date   WBC 6.2 12/11/2019   HGB 14.1 12/11/2019   HCT 42.3 12/11/2019   MCV 83 12/11/2019   PLT 289 12/11/2019   Attestation Statements:   Reviewed by clinician on day of visit: allergies, medications, problem list, medical history, surgical history, family history, social history, and previous encounter notes.  I, 12/13/2019, CMA, am acting as Insurance claims handler for Energy [employer], DO.  I have reviewed the above documentation for accuracy and completeness, and I agree with the above. [employer], DO

## 2020-01-22 ENCOUNTER — Encounter (INDEPENDENT_AMBULATORY_CARE_PROVIDER_SITE_OTHER): Payer: Self-pay | Admitting: Family Medicine

## 2020-01-22 ENCOUNTER — Ambulatory Visit (INDEPENDENT_AMBULATORY_CARE_PROVIDER_SITE_OTHER): Payer: 59 | Admitting: Family Medicine

## 2020-01-22 ENCOUNTER — Other Ambulatory Visit: Payer: Self-pay

## 2020-01-22 VITALS — BP 127/83 | HR 60 | Temp 97.8°F | Ht 64.0 in | Wt 183.0 lb

## 2020-01-22 DIAGNOSIS — E559 Vitamin D deficiency, unspecified: Secondary | ICD-10-CM | POA: Diagnosis not present

## 2020-01-22 DIAGNOSIS — R7303 Prediabetes: Secondary | ICD-10-CM

## 2020-01-22 DIAGNOSIS — E538 Deficiency of other specified B group vitamins: Secondary | ICD-10-CM | POA: Diagnosis not present

## 2020-01-22 DIAGNOSIS — E7849 Other hyperlipidemia: Secondary | ICD-10-CM | POA: Diagnosis not present

## 2020-01-22 DIAGNOSIS — E669 Obesity, unspecified: Secondary | ICD-10-CM

## 2020-01-22 DIAGNOSIS — Z6831 Body mass index (BMI) 31.0-31.9, adult: Secondary | ICD-10-CM

## 2020-01-22 NOTE — Patient Instructions (Signed)
The 10-year ASCVD risk score Denman George DC Montez Hageman., et al., 2013) is: 4.5%   Values used to calculate the score:     Age: 59 years     Sex: Female     Is Non-Hispanic African American: No     Diabetic: No     Tobacco smoker: No     Systolic Blood Pressure: 127 mmHg     Is BP treated: No     HDL Cholesterol: 50 mg/dL     Total Cholesterol: 248 mg/dL

## 2020-01-23 NOTE — Progress Notes (Signed)
Chief Complaint:   OBESITY [name] is here to discuss her progress with her obesity treatment plan along with follow-up of her obesity related diagnoses. [name] is on following a lower carbohydrate, vegetable and lean protein rich diet plan and states she is following her eating plan approximately 100% of the time. [name] states she is walking and riding a bike for 30 minutes 7 times per week.  Today's visit was #: 3 Starting weight: 185 lbs Starting date: 12/11/2019 Today's weight: 183 lbs Today's date: 01/22/2020 Total lbs lost to date: 2 lbs Total lbs lost since last in-office visit: 0  Interim History: [name] says she traveled to [place] of 2-3 weeks and ate out every meal since her last office visit.  She says she did not follow Category 2.  She has kept a food diary on MFP (some of the days as last OV we decided it would be best if she knew how many calories and proteins she was eating per day).   The few days she input data, her input was on average:   Carbs- 80-120 gr /day, and protein- 30-50 gr/ day.      Today, when asked if she would like to follow our low carb nutrition plan instead of the Cat 2, she has hesitancy and is not sure that is best for her.  She wants to know how a keto diet will affect her cholesterol levels and wants to know which plan is best.  I recommend against keto for long-term control of cholesterol esp since it is very difficult to follow long term.  She is just really unsure what plan is best for her despite our conversation about them today.   Subjective:   1. Prediabetes [name] has a diagnosis of prediabetes based on her elevated HgA1c and was informed this puts her at greater risk of developing diabetes. She continues to work on diet and exercise to decrease her risk of diabetes. She denies nausea or hypoglycemia.  She is currently taking in 80-120 carbs per day for the 6-7 days she logged.  This is not considered "keto".  Lab Results  Component Value Date     HGBA1C 5.9 (H) 12/11/2019   Lab Results  Component Value Date   INSULIN 4.9 12/11/2019   2. Vitamin D deficiency [name]'s Vitamin D level was 40.2 on 12/11/2019. She is currently taking prescription vitamin D 50,000 IU each week. She denies nausea, vomiting or muscle weakness.  At her last office visit, she started taking vitamin D weekly.  She has no concerns or complaints.  3. Other hyperlipidemia [name] has hyperlipidemia and has been trying to improve her cholesterol levels with intensive lifestyle modification including a low saturated fat diet, exercise and weight loss. She denies any chest pain, claudication or myalgias.  It was recommended by her PCP that she does not start medications as her ASCVD risk is less than 5.0%.  She was also told by her PCP to follow a low fat diet and exercise.  Lab Results  Component Value Date   ALT 13 12/11/2019   AST 21 12/11/2019   ALKPHOS 73 12/11/2019   BILITOT 0.4 12/11/2019   Lab Results  Component Value Date   CHOL 248 (H) 12/11/2019   HDL 50 12/11/2019   LDLCALC 177 (H) 12/11/2019   TRIG 119 12/11/2019   4. Vitamin B 12 deficiency She is not a vegetarian.  She does not have a previous diagnosis of pernicious anemia.  She does  not have a history of weight loss surgery.  At last office visit, she was told to cut her vitamin B12 dose (OTC) in half.  Lab Results  Component Value Date   VITAMINB12 1,542 (H) 12/11/2019   Assessment/Plan:   1. Prediabetes [name] will continue to work on weight loss, exercise, and decreasing simple carbohydrates to help decrease the risk of diabetes.  Recommend she follow a low carb plan for prevention of progression to diabetes.  She is not sure she wants to follow our low carb plan.  She thins she would like to continue "her keto diet".  2. Vitamin D deficiency Low Vitamin D level contributes to fatigue and are associated with obesity, breast, and colon cancer. She agrees to continue to take prescription  Vitamin D @50 ,000 IU every week and will follow-up for routine testing of Vitamin D, at least 2-3 times per year to avoid over-replacement.  Will continue to monitor levels.  Continue weekly medications, prudent nutritional plan, weight loss.  3. Other hyperlipidemia Cardiovascular risk and specific lipid/LDL goals reviewed.  We discussed several lifestyle modifications today and [name] will continue to work on diet, exercise and weight loss efforts. Orders and follow up as documented in patient record.  I recommend that for her cholesterol issues she follow Category 2 nutritional plan, which limits saturated and trans fat intake and should improve labs.  Keto may or may not improve cholesterol.  Counseling Intensive lifestyle modifications are the first line treatment for this issue. . Dietary changes: Increase soluble fiber. Decrease simple carbohydrates. . Exercise changes: Moderate to vigorous-intensity aerobic activity 150 minutes per week if tolerated. . Lipid-lowering medications: see documented in medical record.  4. Vitamin B 12 deficiency The diagnosis was reviewed with the patient. Counseling provided today, see below. We will continue to monitor. Orders and follow up as documented in patient record.  Will continue to monitor levels.  Counsel: . The body needs vitamin B12: to make red blood cells; to make DNA; and to help the nerves work properly so they can carry messages from the brain to the body.  . The main causes of vitamin B12 deficiency include dietary deficiency, digestive diseases, pernicious anemia, and having a surgery in which part of the stomach or small intestine is removed.  . Certain medicines can make it harder for the body to absorb vitamin B12. These medicines include: heartburn medications; some antibiotics; some medications used to treat diabetes, gout, and high cholesterol.  . In some cases, there are no symptoms of this condition. If the condition leads to anemia  or nerve damage, various symptoms can occur, such as weakness or fatigue, shortness of breath, and numbness or tingling in your hands and feet.   . Treatment:  o May include taking vitamin B12 supplements.  o Avoid alcohol.  o Eat lots of healthy foods that contain vitamin B12: - Beef, pork, chicken, Kenney Houseman, and organ meats, such as liver.  - Seafood: This includes clams, rainbow trout, salmon, tuna, and haddock. Eggs.  - Cereal and dairy products that are fortified: This means that vitamin B12 has been added to the food.   5. Class 1 obesity with serious comorbidity and body mass index (BMI) of 31.0 to 31.9 in adult, unspecified obesity type [name] is currently in the action stage of change. As such, her goal is to continue with weight loss efforts. She has tentatively agreed to the Category 2 Plan.   Exercise goals: As is.  Behavioral modification strategies: increasing  lean protein intake, decreasing simple carbohydrates and planning for success.   [name] has agreed to follow-up with our clinic as needed as she will determine if she would like to follow her own diet versus a nutritional plan of ours/ receive guidance from [place].  She will follow-up once she decides.  Also, I recommend she f/up with our medical director- Dr Dalbert Garnet and also spoke personally with the doctor about pt's concerns.  -  She was informed of the importance of frequent follow-up visits to maximize her success with intensive lifestyle modifications for her multiple health conditions.    Objective:   Blood pressure 127/83, pulse 60, temperature 97.8 F (36.6 C), temperature source Oral, height 5\' 4"  (1.626 m), weight 183 lb (83 kg), SpO2 100 %. Body mass index is 31.41 kg/m.  General: Cooperative, alert, well developed, in no acute distress. HEENT: Conjunctivae and lids unremarkable. Cardiovascular: Regular rhythm.  Lungs: Normal work of breathing. Neurologic: No focal deficits.   Lab Results  Component Value  Date   CREATININE 0.81 12/11/2019   BUN 17 12/11/2019   NA 137 12/11/2019   K 4.2 12/11/2019   CL 99 12/11/2019   CO2 26 12/11/2019   Lab Results  Component Value Date   ALT 13 12/11/2019   AST 21 12/11/2019   ALKPHOS 73 12/11/2019   BILITOT 0.4 12/11/2019   Lab Results  Component Value Date   HGBA1C 5.9 (H) 12/11/2019   Lab Results  Component Value Date   INSULIN 4.9 12/11/2019   Lab Results  Component Value Date   TSH 1.170 12/11/2019   Lab Results  Component Value Date   CHOL 248 (H) 12/11/2019   HDL 50 12/11/2019   LDLCALC 177 (H) 12/11/2019   TRIG 119 12/11/2019   Lab Results  Component Value Date   WBC 6.2 12/11/2019   HGB 14.1 12/11/2019   HCT 42.3 12/11/2019   MCV 83 12/11/2019   PLT 289 12/11/2019   Attestation Statements:   Reviewed by clinician on day of visit: allergies, medications, problem list, medical history, surgical history, family history, social history, and previous encounter notes.  Time spent on visit including pre-visit chart review and post-visit care and charting was 60 minutes.   I, 12/13/2019, CMA, am acting as Insurance claims handler for Energy manager, DO.  I have reviewed the above documentation for accuracy and completeness, and I agree with the above. [employer], DO

## 2020-01-28 ENCOUNTER — Telehealth (INDEPENDENT_AMBULATORY_CARE_PROVIDER_SITE_OTHER): Payer: Self-pay | Admitting: Family Medicine

## 2020-01-28 NOTE — Telephone Encounter (Signed)
Called pt on her cell phone this evening just to touch base about her diet plan and see if I could help answer any Q's/ concerns she may have after last OV with me. She appreciated the call but thinks that doing her "own plan" at this time is best. She thanked me for the call and for me checking in on her.    I told her she can always f/up with [place] in the future if she so chose (again offered her to f/up with our med director Dr Dalbert Garnet.)  and wished her "all the best" with her future health endeavors.    Thomasene Lot, DO 01/28/20 6:33 pm

## 2020-11-18 ENCOUNTER — Other Ambulatory Visit (HOSPITAL_COMMUNITY)
Admission: RE | Admit: 2020-11-18 | Discharge: 2020-11-18 | Disposition: A | Discharge status: Home / Self Care | Payer: 59 | Source: Ambulatory Visit | Attending: Family Medicine | Admitting: Family Medicine

## 2020-11-18 ENCOUNTER — Other Ambulatory Visit: Payer: Self-pay | Admitting: Family Medicine

## 2020-11-18 DIAGNOSIS — Z124 Encounter for screening for malignant neoplasm of cervix: Secondary | ICD-10-CM | POA: Diagnosis present

## 2020-11-20 LAB — CYTOLOGY - PAP
Comment: NEGATIVE
Diagnosis: NEGATIVE
High risk HPV: NEGATIVE

## 2022-01-27 ENCOUNTER — Encounter (INDEPENDENT_AMBULATORY_CARE_PROVIDER_SITE_OTHER): Payer: Self-pay

## 2022-04-19 DIAGNOSIS — Z780 Asymptomatic menopausal state: Secondary | ICD-10-CM | POA: Diagnosis not present

## 2022-04-19 DIAGNOSIS — Z1231 Encounter for screening mammogram for malignant neoplasm of breast: Secondary | ICD-10-CM | POA: Diagnosis not present

## 2022-12-15 DIAGNOSIS — R3915 Urgency of urination: Secondary | ICD-10-CM | POA: Diagnosis not present

## 2022-12-15 DIAGNOSIS — N3090 Cystitis, unspecified without hematuria: Secondary | ICD-10-CM | POA: Diagnosis not present

## 2022-12-15 DIAGNOSIS — E785 Hyperlipidemia, unspecified: Secondary | ICD-10-CM | POA: Diagnosis not present

## 2022-12-15 DIAGNOSIS — Z0000 Encounter for general adult medical examination without abnormal findings: Secondary | ICD-10-CM | POA: Diagnosis not present

## 2022-12-15 DIAGNOSIS — Z800 Family history of malignant neoplasm of digestive organs: Secondary | ICD-10-CM | POA: Diagnosis not present

## 2022-12-15 DIAGNOSIS — F411 Generalized anxiety disorder: Secondary | ICD-10-CM | POA: Diagnosis not present

## 2022-12-15 DIAGNOSIS — E669 Obesity, unspecified: Secondary | ICD-10-CM | POA: Diagnosis not present

## 2022-12-15 DIAGNOSIS — E559 Vitamin D deficiency, unspecified: Secondary | ICD-10-CM | POA: Diagnosis not present

## 2022-12-15 DIAGNOSIS — R7303 Prediabetes: Secondary | ICD-10-CM | POA: Diagnosis not present

## 2023-01-17 ENCOUNTER — Encounter: Payer: Self-pay | Admitting: Gastroenterology

## 2023-03-31 ENCOUNTER — Ambulatory Visit (INDEPENDENT_AMBULATORY_CARE_PROVIDER_SITE_OTHER): Payer: 59 | Admitting: Gastroenterology

## 2023-03-31 ENCOUNTER — Encounter: Payer: Self-pay | Admitting: Gastroenterology

## 2023-03-31 VITALS — BP 130/90 | HR 72 | Ht 63.75 in | Wt 209.5 lb

## 2023-03-31 DIAGNOSIS — Z1211 Encounter for screening for malignant neoplasm of colon: Secondary | ICD-10-CM

## 2023-03-31 DIAGNOSIS — Z800 Family history of malignant neoplasm of digestive organs: Secondary | ICD-10-CM

## 2023-03-31 MED ORDER — NA SULFATE-K SULFATE-MG SULF 17.5-3.13-1.6 GM/177ML PO SOLN: 1.0000 | Freq: Once | ORAL | 0 refills | Status: AC

## 2023-03-31 NOTE — Progress Notes (Signed)
03/31/2023 HIBO BLASDELL [id number] 1960/10/14   HISTORY OF PRESENT ILLNESS: This is a 62 year old female who has been referred here by her PCP, Dr. Cliffton Asters, to discuss colonoscopy as she has a family history of colon cancer in her father.  He was diagnosed around age 63.  We have one colonoscopy report with Dr. Noe Gens from May 2010.  Says that no polyps were noted, but notes that she had an inadequate bowel prep so they recommended a follow-up colonoscopy in 1 year with more aggressive bowel prep.  She says she must of repeated it the following year, but not really sure then if she had any others since then.  She does note that she never had polyps removed.  She denies any GI complaints.  She is moving her bowels regularly even more so now that she has been using some milk Keefer to make yogurt, etc. and that is helped her move her bowels.  No rectal bleeding.  Past Medical History:  Diagnosis Date   Anxiety    Depression    Prediabetes    PVC (premature ventricular contraction)    Vitamin D deficiency    Past Surgical History:  Procedure Laterality Date   REFRACTIVE SURGERY      reports that she has never smoked. She has never used smokeless tobacco. She reports that she does not drink alcohol and does not use drugs. family history includes Atrial fibrillation in her mother; Autism spectrum disorder in her son; Colon cancer in her father; Diabetes in her father and maternal grandfather; Heart attack in her maternal grandmother; Hyperlipidemia in her father; Hypertension in her mother; Lung cancer in her paternal grandfather; Obesity in her mother, sister, and sister; Other in her maternal grandmother; Stroke in her maternal grandfather. No Known Allergies    Outpatient Encounter Medications as of 03/31/2023  Medication Sig   Multiple Vitamin (MULTIVITAMIN PO) Take 1 capsule by mouth daily.   sertraline (ZOLOFT) 25 MG tablet Take 25 mg by mouth daily.   Vitamin D, Ergocalciferol,  (DRISDOL) 1.25 MG (50000 UNIT) CAPS capsule Take 1 capsule (50,000 Units total) by mouth every 7 (seven) days.   [DISCONTINUED] sertraline (ZOLOFT) 50 MG tablet Take 25 mg by mouth 2 (two) times daily.   No facility-administered encounter medications on file as of 03/31/2023.     REVIEW OF SYSTEMS  : All other systems reviewed and negative except where noted in the History of Present Illness.   PHYSICAL EXAM: BP (!) 130/90 (BP Location: Left Arm, Patient Position: Sitting, Cuff Size: Large)   Pulse 72   Ht 5' 3.75" (1.619 m) Comment: height measured without shoes  Wt 209 lb 8 oz (95 kg)   BMI 36.24 kg/m  General: Well developed white female in no acute distress Head: Normocephalic and atraumatic Eyes:  Sclerae anicteric, conjunctiva pink. Ears: Normal auditory acuity Lungs: Clear throughout to auscultation; no W/R/R. Heart: Regular rate and rhythm; no M/R/G. Musculoskeletal: Symmetrical with no gross deformities  Skin: No lesions on visible extremities Extremities: No edema  Neurological: Alert oriented x 4, grossly non-focal Psychological:  Alert and cooperative. Normal mood and affect  ASSESSMENT AND PLAN: *Family history of colon cancer in her father: She has had a couple colonoscopies in the past.  We have one from 2010 with Dr. Noe Gens and she thinks she at least had one after that, maybe 2011 as the one in 2010 was noted to have inadequate prep and they recommended a repeat a year  later.  She is not really exactly sure of the last date.  Nonetheless it has been over 5 years, probably more like 7 years at least from what she can recall.  She has never had colon polyps.  Her father was diagnosed around age 65.  Will schedule for colonoscopy with Dr. Adela Lank.  The risks, benefits, and alternatives to colonoscopy were discussed with the patient and She consents to proceed.  Will give a 2-day bowel prep just to be sure she is cleaned out.  CC:  [name], MD

## 2023-03-31 NOTE — Patient Instructions (Signed)
You have been scheduled for a colonoscopy. Please follow written instructions given to you at your visit today.   Please pick up your prep supplies at the pharmacy within the next 1-3 days.  If you use inhalers (even only as needed), please bring them with you on the day of your procedure.  DO NOT TAKE 7 DAYS PRIOR TO TEST- Trulicity (dulaglutide) Ozempic, Wegovy (semaglutide) Mounjaro (tirzepatide) Bydureon Bcise (exanatide extended release)  DO NOT TAKE 1 DAY PRIOR TO YOUR TEST Rybelsus (semaglutide) Adlyxin (lixisenatide) Victoza (liraglutide) Byetta (exanatide) ________________________________________________________________  _______________________________________________________  If your blood pressure at your visit was 140/90 or greater, please contact your primary care physician to follow up on this.  _______________________________________________________  If you are age 64 or older, your body mass index should be between 23-30. Your Body mass index is 36.24 kg/m. If this is out of the aforementioned range listed, please consider follow up with your Primary Care Provider.  If you are age 89 or younger, your body mass index should be between 19-25. Your Body mass index is 36.24 kg/m. If this is out of the aformentioned range listed, please consider follow up with your Primary Care Provider.   ________________________________________________________  The [facility] providers would like to encourage you to use [facility] to communicate with providers for non-urgent requests or questions.  Due to long hold times on the telephone, sending your provider a message by [facility] may be a faster and more efficient way to get a response.  Please allow 48 business hours for a response.  Please remember that this is for non-urgent requests.  _______________________________________________________

## 2023-03-31 NOTE — Progress Notes (Signed)
Agree with assessment and plan as outlined.  

## 2023-04-14 ENCOUNTER — Encounter: Payer: Self-pay | Admitting: Gastroenterology

## 2023-04-25 DIAGNOSIS — Z1231 Encounter for screening mammogram for malignant neoplasm of breast: Secondary | ICD-10-CM | POA: Diagnosis not present

## 2023-05-02 ENCOUNTER — Encounter: Payer: Self-pay | Admitting: Gastroenterology

## 2023-05-02 ENCOUNTER — Ambulatory Visit (AMBULATORY_SURGERY_CENTER): Payer: 59 | Admitting: Gastroenterology

## 2023-05-02 VITALS — BP 126/80 | HR 57 | Temp 97.3°F | Resp 10 | Ht 63.75 in | Wt 209.8 lb

## 2023-05-02 DIAGNOSIS — D127 Benign neoplasm of rectosigmoid junction: Secondary | ICD-10-CM

## 2023-05-02 DIAGNOSIS — Z800 Family history of malignant neoplasm of digestive organs: Secondary | ICD-10-CM

## 2023-05-02 DIAGNOSIS — F419 Anxiety disorder, unspecified: Secondary | ICD-10-CM | POA: Diagnosis not present

## 2023-05-02 DIAGNOSIS — I493 Ventricular premature depolarization: Secondary | ICD-10-CM | POA: Diagnosis not present

## 2023-05-02 DIAGNOSIS — F32A Depression, unspecified: Secondary | ICD-10-CM | POA: Diagnosis not present

## 2023-05-02 DIAGNOSIS — R7303 Prediabetes: Secondary | ICD-10-CM | POA: Diagnosis not present

## 2023-05-02 DIAGNOSIS — Z1211 Encounter for screening for malignant neoplasm of colon: Secondary | ICD-10-CM

## 2023-05-02 MED ORDER — SODIUM CHLORIDE 0.9 % IV SOLN: 500.0000 mL | Freq: Once | INTRAVENOUS | Status: AC

## 2023-05-02 NOTE — Op Note (Signed)
[facility] Patient Name: [name] Procedure Date: 05/02/2023 1:10 PM MRN: [id number] Endoscopist: Viviann Spare P. Adela Lank , MD, [id number] Age: 62 Referring MD:  Date of Birth: [birth year] Gender: Female Account #: [id number] Procedure:                Colonoscopy Indications:              Screening in patient at increased risk: Family                            history of 1st-degree relative with colorectal                            cancer (father dx age 63) Medicines:                Monitored Anesthesia Care Procedure:                Pre-Anesthesia Assessment:                           - Prior to the procedure, a History and Physical                            was performed, and patient medications and                            allergies were reviewed. The patient's tolerance of                            previous anesthesia was also reviewed. The risks                            and benefits of the procedure and the sedation                            options and risks were discussed with the patient.                            All questions were answered, and informed consent                            was obtained. Prior Anticoagulants: The patient has                            taken no anticoagulant or antiplatelet agents. ASA                            Grade Assessment: II - A patient with mild systemic                            disease. After reviewing the risks and benefits,                            the patient was deemed in satisfactory condition to  undergo the procedure.                           After obtaining informed consent, the colonoscope                            was passed under direct vision. Throughout the                            procedure, the patient's blood pressure, pulse, and                            oxygen saturations were monitored continuously. The                            Olympus Scope SN [id number] was  introduced through the                            anus and advanced to the the cecum, identified by                            appendiceal orifice and ileocecal valve. The                            colonoscopy was performed without difficulty. The                            patient tolerated the procedure well. The quality                            of the bowel preparation was good. The ileocecal                            valve, appendiceal orifice, and rectum were                            photographed. Scope In: 1:19:34 PM Scope Out: 1:40:10 PM Scope Withdrawal Time: 0 hours 16 minutes 27 seconds  Total Procedure Duration: 0 hours 20 minutes 36 seconds  Findings:                 The perianal and digital rectal examinations were                            normal.                           A 3 to 4 mm polyp was found in the recto-sigmoid                            colon. The polyp was sessile. The polyp was removed                            with a cold snare. Resection and retrieval were  complete.                           A few small-mouthed diverticula were found in the                            sigmoid colon.                           Internal hemorrhoids were found during                            retroflexion. The hemorrhoids were small.                           The exam was otherwise without abnormality. Complications:            No immediate complications. Estimated blood loss:                            Minimal. Estimated Blood Loss:     Estimated blood loss was minimal. Impression:               - One 3 to 4 mm polyp at the recto-sigmoid colon,                            removed with a cold snare. Resected and retrieved.                           - Diverticulosis in the sigmoid colon.                           - Internal hemorrhoids.                           - The examination was otherwise normal. Recommendation:           - Patient has a  contact number available for                            emergencies. The signs and symptoms of potential                            delayed complications were discussed with the                            patient. Return to normal activities tomorrow.                            Written discharge instructions were provided to the                            patient.                           - Resume previous diet.                           -  Continue present medications.                           - Await pathology results. Anticipate repeat                            colonoscopy in 5 years given strong family history                            of colon cancer. Viviann Spare P. Taitum Alms, MD 05/02/2023 1:45:11 PM This report has been signed electronically.

## 2023-05-02 NOTE — Progress Notes (Signed)
[facility] History and Physical   Primary Care Physician:  [name], MD   Reason for Procedure:   Family history of colon cancer  Plan:    colonoscopy     HPI: [name] is a 62 y.o. female  here for colonoscopy screening - father had colon cancer dx age 36. Her last exam was 6 years ago - she thinks normal. No records available of that exam.   Patient denies any bowel symptoms at this time.  Otherwise feels well without any cardiopulmonary symptoms.   I have discussed risks / benefits of anesthesia and endoscopic procedure with Colin Ina and they wish to proceed with the exams as outlined today.    Past Medical History:  Diagnosis Date   Anxiety    Depression    Prediabetes    PVC (premature ventricular contraction)    Vitamin D deficiency     Past Surgical History:  Procedure Laterality Date   REFRACTIVE SURGERY      Prior to Admission medications   Medication Sig Start Date End Date Taking? Authorizing Provider  cholecalciferol (VITAMIN D3) 25 MCG (1000 UNIT) tablet Take 2,000 Units by mouth daily.   Yes [provider]  Multiple Vitamin (MULTIVITAMIN PO) Take 1 capsule by mouth daily.   Yes [provider]  sertraline (ZOLOFT) 25 MG tablet Take 25 mg by mouth daily.   Yes [provider]    Current Outpatient Medications  Medication Sig Dispense Refill   cholecalciferol (VITAMIN D3) 25 MCG (1000 UNIT) tablet Take 2,000 Units by mouth daily.     Multiple Vitamin (MULTIVITAMIN PO) Take 1 capsule by mouth daily.     sertraline (ZOLOFT) 25 MG tablet Take 25 mg by mouth daily.     Current Facility-Administered Medications  Medication Dose Route Frequency Provider Last Rate Last Admin   0.9 %  sodium chloride infusion  500 mL Intravenous Once Mikey Maffett, Willaim Rayas, MD        Allergies as of 05/02/2023   (No Known Allergies)    Family History  Problem Relation Age of Onset   Hypertension Mother    Obesity  Mother    Atrial fibrillation Mother    Diabetes Father    Hyperlipidemia Father    Colon cancer Father    Obesity Sister    Obesity Sister    Heart attack Maternal Grandmother    Other Maternal Grandmother        brain tumor   Diabetes Maternal Grandfather    Stroke Maternal Grandfather    Lung cancer Paternal Grandfather    Autism spectrum disorder Son    Esophageal cancer Neg Hx    Stomach cancer Neg Hx     Social History   Socioeconomic History   Marital status: Married    Spouse name: Not on file   Number of children: 3   Years of education: Not on file   Highest education level: Not on file  Occupational History   Occupation: retired  Tobacco Use   Smoking status: Never   Smokeless tobacco: Never  Vaping Use   Vaping status: Never Used  Substance and Sexual Activity   Alcohol use: Never   Drug use: Never   Sexual activity: Not on file  Other Topics Concern   Not on file  Social History Narrative   Not on file   Social Determinants of Health   Financial Resource Strain: Not on file  Food Insecurity: Not on file  Transportation Needs: Not on file  Physical Activity: Not on file  Stress: Not on file  Social Connections: Not on file  Intimate Partner Violence: Not on file    Review of Systems: All other review of systems negative except as mentioned in the HPI.  Physical Exam: Vital signs BP 136/74   Pulse 60   Temp (!) 97.3 F (36.3 C)   Ht 5' 3.75" (1.619 m)   Wt 209 lb 12.8 oz (95.2 kg)   SpO2 100%   BMI 36.30 kg/m   General:   Alert,  Well-developed, pleasant and cooperative in NAD Lungs:  Clear throughout to auscultation.   Heart:  Regular rate and rhythm Abdomen:  Soft, nontender and nondistended.   Neuro/Psych:  Alert and cooperative. Normal mood and affect. A and O x 3  Harlin Rain, MD [facility]

## 2023-05-02 NOTE — Patient Instructions (Signed)

## 2023-05-02 NOTE — Progress Notes (Signed)
Sedate, gd SR, tolerated procedure well, VSS, report to RN 

## 2023-05-02 NOTE — Progress Notes (Signed)
Called to room to assist during endoscopic procedure.  Patient ID and intended procedure confirmed with present staff. Received instructions for my participation in the procedure from the performing physician.  

## 2023-05-03 ENCOUNTER — Telehealth: Payer: Self-pay

## 2023-05-03 NOTE — Telephone Encounter (Signed)
  Follow up Call-     05/02/2023   12:47 PM  Call back number  Post procedure Call Back phone  # [phone]  Permission to leave phone message Yes     Patient questions:  Do you have a fever, pain , or abdominal swelling? No. Pain Score  0 *  Have you tolerated food without any problems? Yes.    Have you been able to return to your normal activities? Yes.    Do you have any questions about your discharge instructions: Diet   No. Medications  No. Follow up visit  No.  Do you have questions or concerns about your Care? No.  Actions: * If pain score is 4 or above: No action needed, pain <4.

## 2023-05-05 LAB — SURGICAL PATHOLOGY

## 2023-05-11 ENCOUNTER — Encounter: Payer: Self-pay | Admitting: Gastroenterology

## 2023-12-24 ENCOUNTER — Ambulatory Visit: Admission: EM | Admit: 2023-12-24 | Discharge: 2023-12-24 | Disposition: A | Discharge status: Home / Self Care

## 2023-12-24 ENCOUNTER — Encounter: Payer: Self-pay | Admitting: Emergency Medicine

## 2023-12-24 DIAGNOSIS — T63301A Toxic effect of unspecified spider venom, accidental (unintentional), initial encounter: Secondary | ICD-10-CM | POA: Diagnosis not present

## 2023-12-24 MED ORDER — FEXOFENADINE HCL 180 MG PO TABS: 180.0000 mg | ORAL_TABLET | Freq: Every day | ORAL | 0 refills | Status: AC

## 2023-12-24 NOTE — Discharge Instructions (Addendum)
  1. Spider bite allergy, current reaction, accidental or unintentional, initial encounter (Primary) - fexofenadine  (ALLEGRA ) 180 MG tablet; Take 1 tablet (180 mg total) by mouth daily.  Dispense: 30 tablet; Refill: 0 - Take ibuprofen or Tylenol as needed for any pain or inflammation secondary to spider bite - Apply ice 2-3 times a day for 10 to 15 minutes at a time to help with inflammation and pain - Do not be surprised if over the next day or so you develop pain and soreness around the bite as most spider venom's are neurotoxins and cause numbness initially but may begin to become painful. - Continue to monitor area for any changes in current symptoms if you develop any severe erythema, swelling, pain, drainage, fever follow-up for further evaluation and possible need for antibiotic therapy. - Most spider bites may cause open ulcerations as affected tissue begins to die and right away, this is a common finding with spider bites.  If symptoms are severe follow-up with wound care may be recommended but in most cases is not required.

## 2023-12-24 NOTE — ED Provider Notes (Signed)
 [facility] Lead  Note:  This document was prepared using Conservation officer, historic buildings and may include unintentional dictation errors.  MRN: [id number] DOB: 1961/01/10  Subjective:   [name] is a 63 y.o. female presenting for a suspected spider bite that occurred yesterday.  Patient reports she was working at her mother's house moving some buckets in the garden when she felt slight pain to her left thumb.  Patient reports that area turned purple in color and was slightly numb.  Patient reports there is still some numbness and purpleish coloration is getting worse as well as swelling that occurred this morning.  Patient states that pain is 1/10.  Patient is concern for spider bite although she did not see the spider at the time.  Patient was advised by her sister to come to urgent care for evaluation because of the purple coloration to the finger.  Patient denies taking any over-the-counter antihistamine or pain medication to treat symptoms.   Current Facility-Administered Medications:    0.9 %  sodium chloride  infusion, 500 mL, Intravenous, Once, Armbruster, Elspeth SQUIBB, MD  Current Outpatient Medications:    fexofenadine  (ALLEGRA ) 180 MG tablet, Take 1 tablet (180 mg total) by mouth daily., Disp: 30 tablet, Rfl: 0   cholecalciferol (VITAMIN D3) 25 MCG (1000 UNIT) tablet, Take 2,000 Units by mouth daily., Disp: , Rfl:    Multiple Vitamin (MULTIVITAMIN PO), Take 1 capsule by mouth daily., Disp: , Rfl:    sertraline (ZOLOFT) 25 MG tablet, Take 25 mg by mouth daily., Disp: , Rfl:    No Known Allergies  Past Medical History:  Diagnosis Date   Anxiety    Depression    Prediabetes    PVC (premature ventricular contraction)    Vitamin D  deficiency      Past Surgical History:  Procedure Laterality Date   REFRACTIVE SURGERY      Family History  Problem Relation Age of Onset   Hypertension Mother    Obesity Mother    Atrial fibrillation Mother    Diabetes Father     Hyperlipidemia Father    Colon cancer Father    Obesity Sister    Obesity Sister    Heart attack Maternal Grandmother    Other Maternal Grandmother        brain tumor   Diabetes Maternal Grandfather    Stroke Maternal Grandfather    Lung cancer Paternal Grandfather    Autism spectrum disorder Son    Esophageal cancer Neg Hx    Stomach cancer Neg Hx     Social History   Tobacco Use   Smoking status: Never   Smokeless tobacco: Never  Vaping Use   Vaping status: Never Used  Substance Use Topics   Alcohol use: Never   Drug use: Never    ROS Refer to HPI for ROS details.  Objective:   Vitals: BP (!) 143/89 (BP Location: Right Arm)   Pulse 71   Temp 98.1 F (36.7 C) (Oral)   Resp 18   SpO2 99%   Physical Exam Vitals and nursing note reviewed.  Constitutional:      General: She is not in acute distress.    Appearance: Normal appearance. She is well-developed. She is not ill-appearing or toxic-appearing.  HENT:     Head: Normocephalic and atraumatic.  Cardiovascular:     Rate and Rhythm: Normal rate.  Pulmonary:     Effort: Pulmonary effort is normal. No respiratory distress.  Skin:    General: Skin  is warm and dry.     Capillary Refill: Capillary refill takes less than 2 seconds.     Findings: Bruising and erythema present.  Neurological:     General: No focal deficit present.     Mental Status: She is alert and oriented to person, place, and time.  Psychiatric:        Mood and Affect: Mood normal.        Behavior: Behavior normal.     Procedures  No results found for this or any previous visit (from the past 24 hours).  No results found.   Assessment and Plan :     Discharge Instructions       1. Spider bite allergy, current reaction, accidental or unintentional, initial encounter (Primary) - fexofenadine  (ALLEGRA ) 180 MG tablet; Take 1 tablet (180 mg total) by mouth daily.  Dispense: 30 tablet; Refill: 0 - Take ibuprofen or Tylenol as needed  for any pain or inflammation secondary to spider bite - Apply ice 2-3 times a day for 10 to 15 minutes at a time to help with inflammation and pain - Do not be surprised if over the next day or so you develop pain and soreness around the bite as most spider venom's are neurotoxins and cause numbness initially but may begin to become painful. - Continue to monitor area for any changes in current symptoms if you develop any severe erythema, swelling, pain, drainage, fever follow-up for further evaluation and possible need for antibiotic therapy. - Most spider bites may cause open ulcerations as affected tissue begins to die and right away, this is a common finding with spider bites.  If symptoms are severe follow-up with wound care may be recommended but in most cases is not required.      Lilton Pare B Lacorey Brusca   [place], [place], [place] 12/24/23 1121

## 2023-12-24 NOTE — ED Triage Notes (Signed)
 Patient reports that she got bitten by a spider on yesterday while outside on left thumb. Patient finger now has purple color to it. Patient reports pain and swelling. Rates pain 1/10.

## 2023-12-28 ENCOUNTER — Other Ambulatory Visit: Payer: Self-pay | Admitting: Family Medicine

## 2023-12-28 DIAGNOSIS — E785 Hyperlipidemia, unspecified: Secondary | ICD-10-CM

## 2024-01-03 ENCOUNTER — Ambulatory Visit
Admission: RE | Admit: 2024-01-03 | Discharge: 2024-01-03 | Disposition: A | Discharge status: Home / Self Care | Source: Ambulatory Visit | Attending: Family Medicine | Admitting: Family Medicine

## 2024-01-03 DIAGNOSIS — E785 Hyperlipidemia, unspecified: Secondary | ICD-10-CM
